# Patient Record
Sex: Male | Born: 1975 | Race: Black or African American | Hispanic: No | Marital: Single | Smoking: Current every day smoker
Health system: Southern US, Community
[De-identification: ages and names within clinical notes are randomized; demographics above are authoritative.]

## PROBLEM LIST (undated history)

## (undated) DIAGNOSIS — I10 Essential (primary) hypertension: Secondary | ICD-10-CM

---

## 2008-02-02 ENCOUNTER — Emergency Department: Payer: Self-pay | Admitting: Emergency Medicine

## 2008-12-10 ENCOUNTER — Emergency Department (HOSPITAL_COMMUNITY): Admission: EM | Admit: 2008-12-10 | Discharge: 2008-12-10 | Payer: Self-pay | Admitting: Emergency Medicine

## 2011-04-28 ENCOUNTER — Emergency Department: Payer: Self-pay | Admitting: Emergency Medicine

## 2011-05-20 ENCOUNTER — Encounter (HOSPITAL_COMMUNITY): Payer: Self-pay

## 2011-05-20 ENCOUNTER — Emergency Department (HOSPITAL_COMMUNITY)
Admission: EM | Admit: 2011-05-20 | Discharge: 2011-05-20 | Disposition: A | Discharge status: Home / Self Care | Payer: Self-pay | Attending: Emergency Medicine | Admitting: Emergency Medicine

## 2011-05-20 DIAGNOSIS — R300 Dysuria: Secondary | ICD-10-CM | POA: Insufficient documentation

## 2011-05-20 DIAGNOSIS — A64 Unspecified sexually transmitted disease: Secondary | ICD-10-CM | POA: Insufficient documentation

## 2011-05-20 DIAGNOSIS — R369 Urethral discharge, unspecified: Secondary | ICD-10-CM | POA: Insufficient documentation

## 2011-05-20 MED ORDER — AZITHROMYCIN 250 MG PO TABS
1000.0000 mg | ORAL_TABLET | Freq: Once | ORAL | Status: AC
  Administered 2011-05-20: 1000 mg via ORAL
  Filled 2011-05-20: qty 4

## 2011-05-20 MED ORDER — CEFTRIAXONE SODIUM 250 MG IJ SOLR
250.0000 mg | Freq: Once | INTRAMUSCULAR | Status: AC
  Administered 2011-05-20: 250 mg via INTRAMUSCULAR
  Filled 2011-05-20: qty 250

## 2011-05-20 MED ORDER — LIDOCAINE HCL (PF) 1 % IJ SOLN
INTRAMUSCULAR | Status: AC
  Filled 2011-05-20: qty 5

## 2011-05-20 NOTE — ED Notes (Signed)
Yesterday noted clear discharge and pain with uriantion from penis.

## 2011-05-20 NOTE — Discharge Instructions (Signed)
You have a sexually transmitted disease.  We have treated U. in emergency Department, you do not need any further treatment.  Have your sexual partners evaluated for infection as well.  Use condoms when you have intercourse to prevent recurrence of infections including AIDS and herpes, which are both not curable diseases.

## 2011-05-20 NOTE — ED Provider Notes (Signed)
History     CSN: [id number]  Arrival date & time 05/20/11  7829   First MD Initiated Contact with Patient 05/20/11 1127      Chief Complaint  Patient presents with  . Penile Discharge    (Consider location/radiation/quality/duration/timing/severity/associated sxs/prior treatment) Patient is a 36 y.o. male presenting with penile discharge. The history is provided by the patient.  Penile Discharge Pertinent negatives include no abdominal pain and no headaches.   the patient is a 36 year old, male, with no significant past medical history presents emergency department complaining of a penile discharge and dysuria.  Since last night.  He states that he had unprotected sex with more than one sexual partner.  This past weekend.  He denies nausea, vomiting, fevers, chills, or sore on his penis.  He is allergic to no medications.  The  History reviewed. No pertinent past medical history.  History reviewed. No pertinent past surgical history.  History reviewed. No pertinent family history.  History  Substance Use Topics  . Smoking status: Current Everyday Smoker  . Smokeless tobacco: Not on file  . Alcohol Use: Yes      Review of Systems  Constitutional: Negative for fever and chills.  Gastrointestinal: Negative for nausea, vomiting and abdominal pain.  Genitourinary: Positive for dysuria and discharge. Negative for penile swelling, genital sores and penile pain.  Skin: Negative for rash.  Neurological: Negative for headaches.  Hematological: Negative for adenopathy.  Psychiatric/Behavioral: Negative for confusion.  All other systems reviewed and are negative.    Allergies  Review of patient's allergies indicates no known allergies.  Home Medications  No current outpatient prescriptions on file.  BP 167/93  Pulse 85  Temp 97.9 F (36.6 C)  Resp 16  SpO2 100%  Physical Exam  Vitals reviewed. Constitutional: He appears well-developed and well-nourished.  HENT:    Head: Normocephalic and atraumatic.  Eyes: Pupils are equal, round, and reactive to light.  Neck: Normal range of motion.  Pulmonary/Chest: Effort normal.  Genitourinary: No penile tenderness.       Yellow discharge from urethral meatus  Musculoskeletal: Normal range of motion.  Neurological: He is alert.  Skin: Skin is warm and dry. No rash noted.  Psychiatric: He has a normal mood and affect. Thought content normal.    ED Course  Procedures (including critical care time) Penile discharge in young male, who has had recent unprotected intercourse with more than one sexual partner.  No systemic symptoms.  No past medical history.  Urethral swab was performed in the emergency department and antibiotics administered  Labs Reviewed - No data to display No results found.   No diagnosis found.    MDM  Sexually transmitted disease, without any systemic symptoms.        Cheri Guppy, MD 05/20/11 1143

## 2011-05-20 NOTE — ED Notes (Signed)
Patient states is sexual active 3 days ago ago and developed tingling burning sensation tip of penis, dysuria, and clear mucous drainage one day ago.  Pain 7-10/10.

## 2011-05-21 LAB — GC/CHLAMYDIA PROBE AMP, GENITAL: Chlamydia, DNA Probe: NEGATIVE

## 2011-07-08 ENCOUNTER — Emergency Department: Payer: Self-pay | Admitting: *Deleted

## 2011-07-08 LAB — COMPREHENSIVE METABOLIC PANEL
Albumin: 4.2 g/dL (ref 3.4–5.0)
Anion Gap: 7 (ref 7–16)
BUN: 18 mg/dL (ref 7–18)
Bilirubin,Total: 1.1 mg/dL — ABNORMAL HIGH (ref 0.2–1.0)
Calcium, Total: 9.2 mg/dL (ref 8.5–10.1)
Chloride: 106 mmol/L (ref 98–107)
EGFR (Non-African Amer.): >60
Glucose: 129 mg/dL — ABNORMAL HIGH (ref 65–99)

## 2011-07-08 LAB — CBC
HGB: 17.9 g/dL (ref 13.0–18.0)
MCV: 88 fL (ref 80–100)
WBC: 8.5 10*3/uL (ref 3.8–10.6)

## 2012-08-06 ENCOUNTER — Emergency Department: Payer: Self-pay | Admitting: Emergency Medicine

## 2012-08-06 LAB — URINALYSIS, COMPLETE
Bilirubin,UR: NEGATIVE
Ph: 6 (ref 4.5–8.0)
Protein: NEGATIVE
Squamous Epithelial: NONE SEEN

## 2012-08-06 LAB — GC/CHLAMYDIA PROBE AMP

## 2012-10-23 ENCOUNTER — Emergency Department: Payer: Self-pay | Admitting: Emergency Medicine

## 2013-09-12 IMAGING — CR RIGHT THUMB 2+V
1 series · 3 of 3 positions shown · non-contrast
Comparison: none

REASON FOR EXAM: pain/edema
COMMENTS:   May transport without cardiac monitor

PROCEDURE:     DXR - DXR THUMB RIGHT HAND (1ST DIGIT)  - April 28, 2011 [DATE]
RESULT:     No fracture, dislocation or other acute bony abnormality is
identified.

[Series 1: x finger pa right · 0.14mm/px · 3 of 3 slices shown]
[im 1/3]
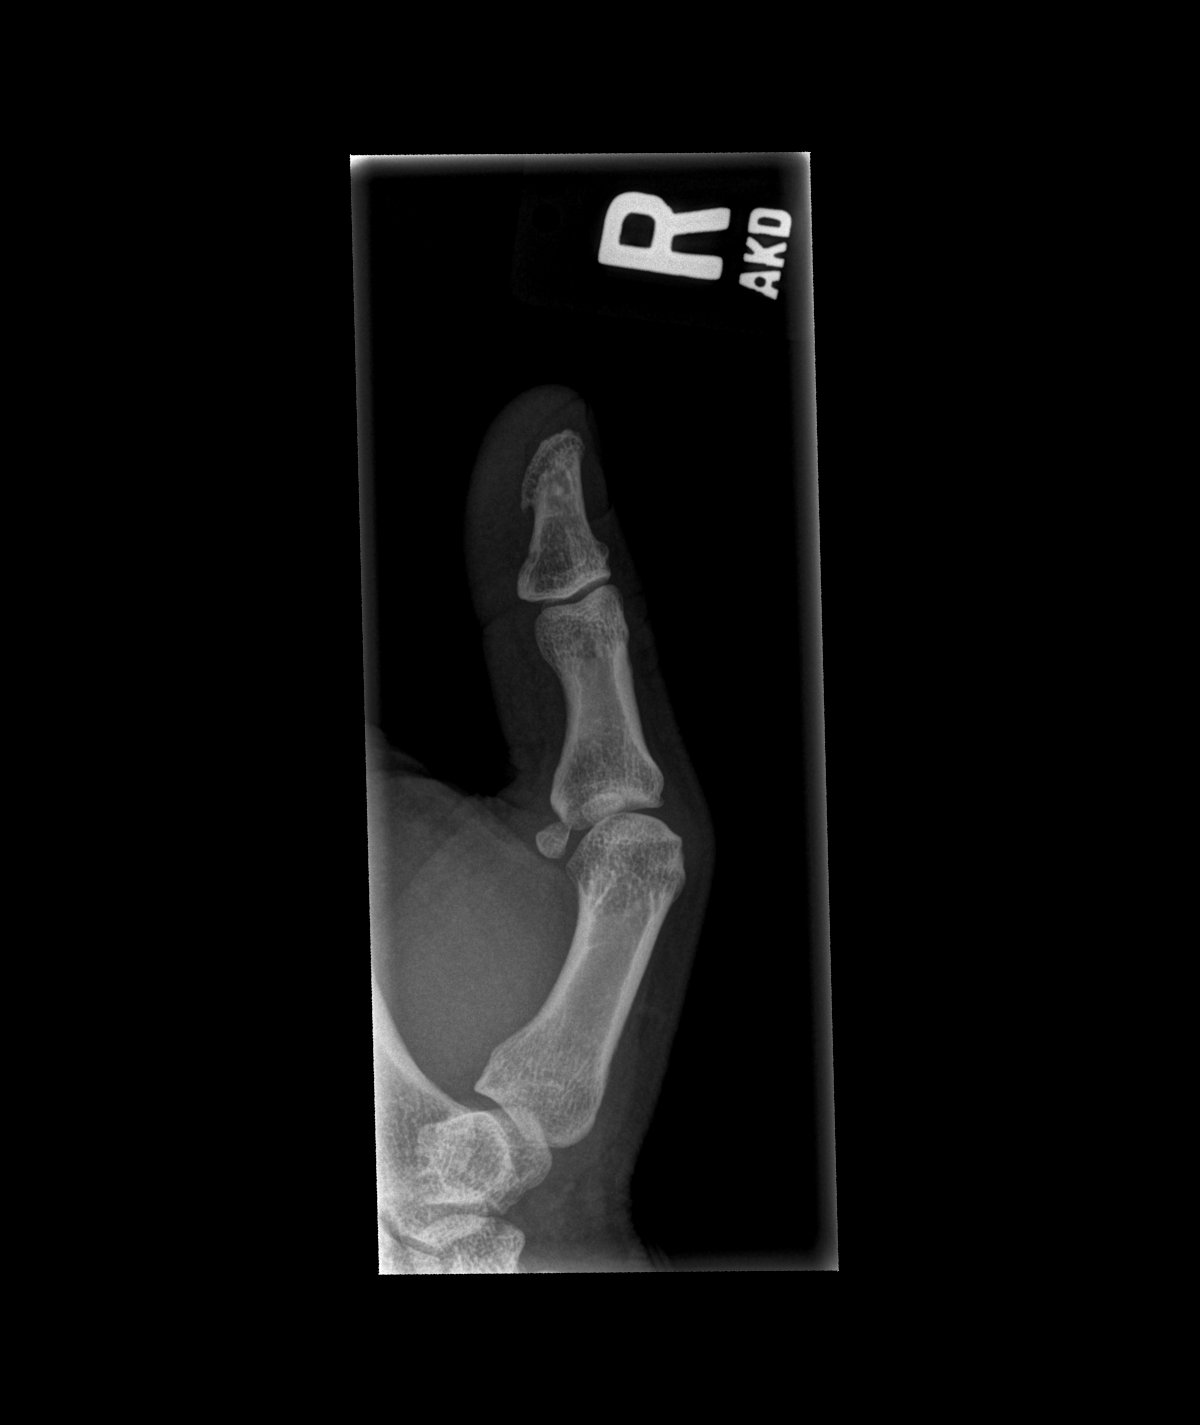
[im 2/3]
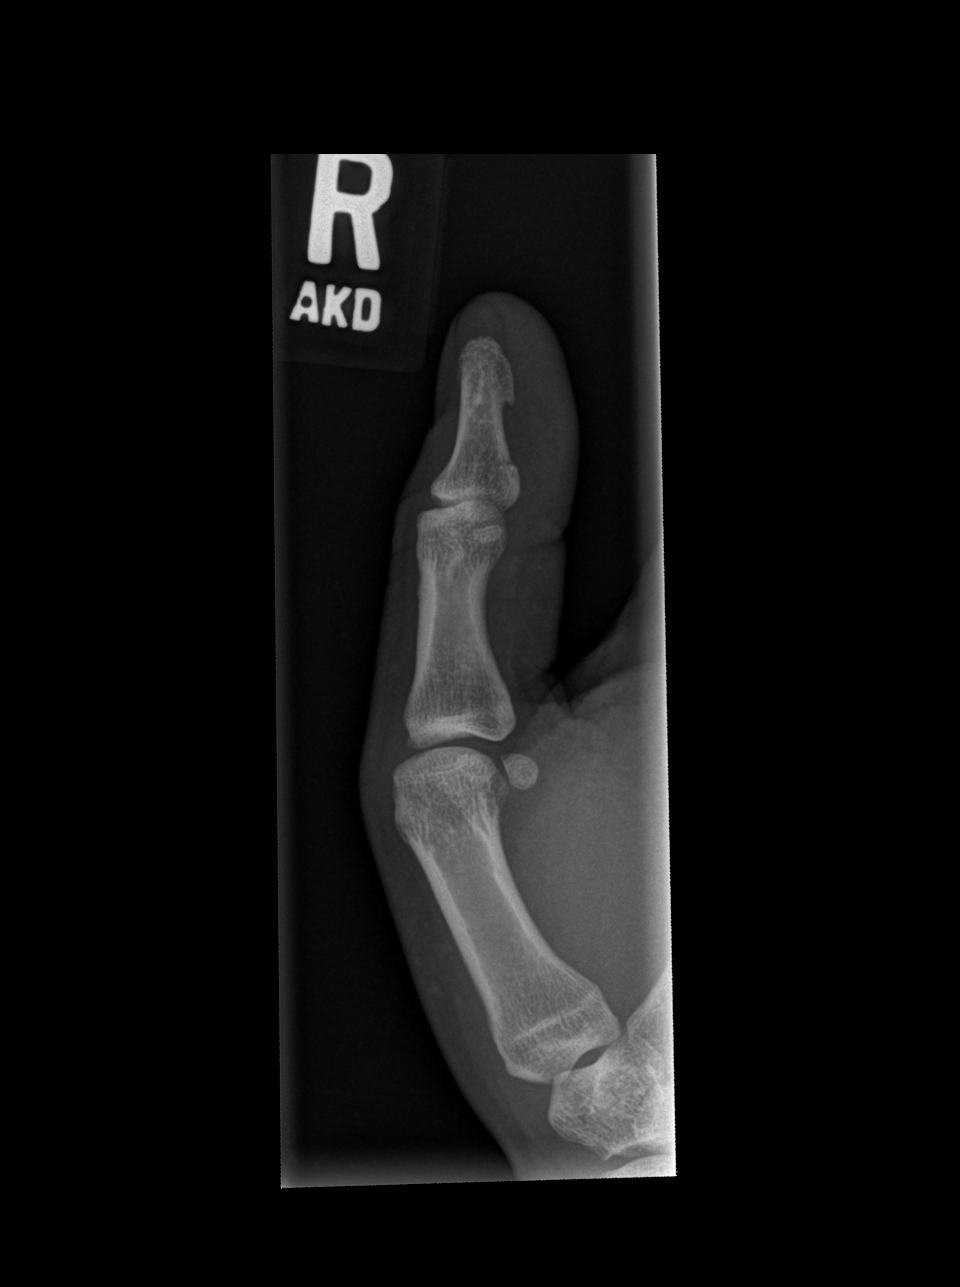
[im 3/3]
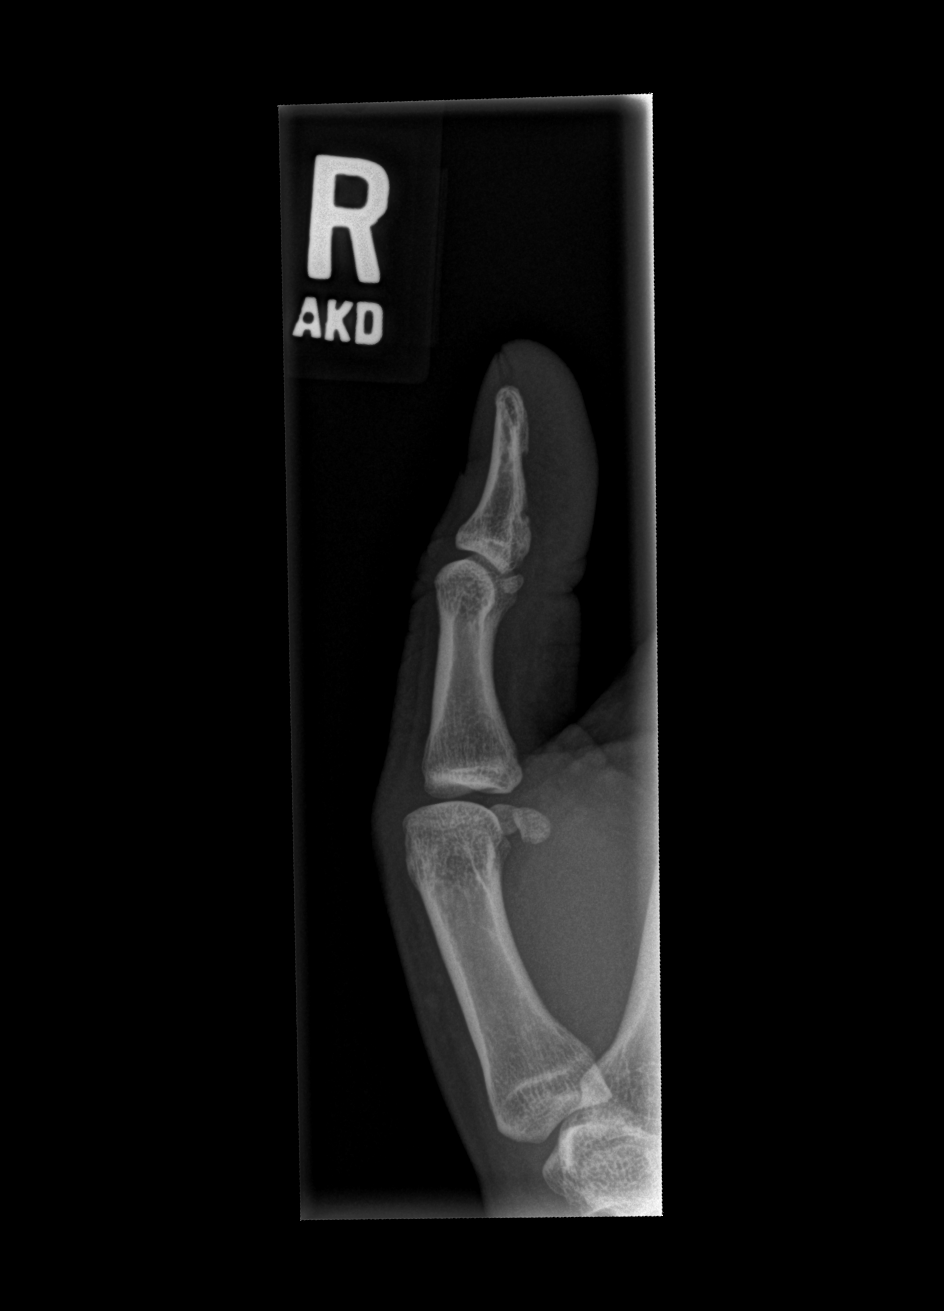

[3 of 3 positions shown; findings below may reference images not displayed]

IMPRESSION: No significant osseous abnormalities are noted.

## 2015-05-08 ENCOUNTER — Emergency Department
Admission: EM | Admit: 2015-05-08 | Discharge: 2015-05-08 | Disposition: A | Discharge status: Home / Self Care | Payer: BLUE CROSS/BLUE SHIELD | Attending: Emergency Medicine | Admitting: Emergency Medicine

## 2015-05-08 ENCOUNTER — Encounter: Payer: Self-pay | Admitting: Emergency Medicine

## 2015-05-08 DIAGNOSIS — J029 Acute pharyngitis, unspecified: Secondary | ICD-10-CM | POA: Diagnosis present

## 2015-05-08 DIAGNOSIS — I10 Essential (primary) hypertension: Secondary | ICD-10-CM | POA: Diagnosis not present

## 2015-05-08 DIAGNOSIS — J069 Acute upper respiratory infection, unspecified: Secondary | ICD-10-CM | POA: Diagnosis not present

## 2015-05-08 DIAGNOSIS — F17200 Nicotine dependence, unspecified, uncomplicated: Secondary | ICD-10-CM | POA: Insufficient documentation

## 2015-05-08 HISTORY — DX: Essential (primary) hypertension: I10

## 2015-05-08 LAB — POCT RAPID STREP A: STREPTOCOCCUS, GROUP A SCREEN (DIRECT): NEGATIVE

## 2015-05-08 MED ORDER — CETIRIZINE HCL 10 MG PO TABS: 10.0000 mg | ORAL_TABLET | Freq: Every day | ORAL | Status: AC

## 2015-05-08 MED ORDER — FLUTICASONE PROPIONATE 50 MCG/ACT NA SUSP: 1.0000 | Freq: Two times a day (BID) | NASAL | Status: AC

## 2015-05-08 MED ORDER — BENZONATATE 200 MG PO CAPS: 200.0000 mg | ORAL_CAPSULE | Freq: Three times a day (TID) | ORAL | Status: AC | PRN

## 2015-05-08 MED ORDER — MAGIC MOUTHWASH W/LIDOCAINE: 5.0000 mL | Freq: Four times a day (QID) | ORAL | Status: AC

## 2015-05-08 NOTE — ED Notes (Signed)
Pt reports cough, nasal congestion, body aches, sore throat. Denies fever, n/v/d. Started two days ago. Pt alert & oriented with warm, dry skin.

## 2015-05-08 NOTE — ED Notes (Signed)
C/o sore throat and cough.  Onset of symptoms 2 days ago.  Also describes sinus congestion.

## 2015-05-08 NOTE — Discharge Instructions (Signed)
Viral Infections °A viral infection can be caused by different types of viruses. Most viral infections are not serious and resolve on their own. However, some infections may cause severe symptoms and may lead to further complications. °SYMPTOMS °Viruses can frequently cause: °· Minor sore throat. °· Aches and pains. °· Headaches. °· Runny nose. °· Different types of rashes. °· Watery eyes. °· Tiredness. °· Cough. °· Loss of appetite. °· Gastrointestinal infections, resulting in nausea, vomiting, and diarrhea. °These symptoms do not respond to antibiotics because the infection is not caused by bacteria. However, you might catch a bacterial infection following the viral infection. This is sometimes called a "superinfection." Symptoms of such a bacterial infection may include: °· Worsening sore throat with pus and difficulty swallowing. °· Swollen neck glands. °· Chills and a high or persistent fever. °· Severe headache. °· Tenderness over the sinuses. °· Persistent overall ill feeling (malaise), muscle aches, and tiredness (fatigue). °· Persistent cough. °· Yellow, green, or brown mucus production with coughing. °HOME CARE INSTRUCTIONS  °· Only take over-the-counter or prescription medicines for pain, discomfort, diarrhea, or fever as directed by your caregiver. °· Drink enough water and fluids to keep your urine clear or pale yellow. Sports drinks can provide valuable electrolytes, sugars, and hydration. °· Get plenty of rest and maintain proper nutrition. Soups and broths with crackers or rice are fine. °SEEK IMMEDIATE MEDICAL CARE IF:  °· You have severe headaches, shortness of breath, chest pain, neck pain, or an unusual rash. °· You have uncontrolled vomiting, diarrhea, or you are unable to keep down fluids. °· You or your child has an oral temperature above 102° F (38.9° C), not controlled by medicine. °· Your baby is older than 3 months with a rectal temperature of 102° F (38.9° C) or higher. °· Your baby is 3  months old or younger with a rectal temperature of 100.4° F (38° C) or higher. °MAKE SURE YOU:  °· Understand these instructions. °· Will watch your condition. °· Will get help right away if you are not doing well or get worse. °  °This information is not intended to replace advice given to you by your health care provider. Make sure you discuss any questions you have with your health care provider. °  °Document Released: 11/28/2004 Document Revised: 05/13/2011 Document Reviewed: 07/27/2014 °Elsevier Interactive Patient Education ©2016 [employer] ° °

## 2015-05-08 NOTE — ED Provider Notes (Signed)
[facility] Emergency Department Provider Note  ____________________________________________  Time seen: Approximately 5:53 PM  I have reviewed the triage vital signs and the nursing notes.   HISTORY  Chief Complaint Sore Throat and Cough    HPI Kyle Ingram is a 40 y.o. male who presents emergency department for a 3 day history of nasal congestion, cough, sore throat, myalgias. Patient states that he has not checked his temperature but does not believe that he has one. He has not had any nausea, vomiting, diarrhea. Patient has not taken any medications prior to arrival.   Past Medical History  Diagnosis Date  . Hypertension     There are no active problems to display for this patient.   History reviewed. No pertinent past surgical history.  Current Outpatient Rx  Name  Route  Sig  Dispense  Refill  . benzonatate (TESSALON) 200 MG capsule   Oral   Take 1 capsule (200 mg total) by mouth 3 (three) times daily as needed for cough.   21 capsule   0   . cetirizine (ZYRTEC) 10 MG tablet   Oral   Take 1 tablet (10 mg total) by mouth daily.   30 tablet   0   . fluticasone (FLONASE) 50 MCG/ACT nasal spray   Each Nare   Place 1 spray into both nostrils 2 (two) times daily.   16 g   0   . magic mouthwash w/lidocaine SOLN   Oral   Take 5 mLs by mouth 4 (four) times daily.   240 mL   0     Dispense in a 1/1/1/1 ratio. Use lidocaine, diphen ...     Allergies Review of patient's allergies indicates no known allergies.  No family history on file.  Social History Social History  Substance Use Topics  . Smoking status: Current Every Day Smoker  . Smokeless tobacco: None  . Alcohol Use: Yes     Review of Systems  Constitutional: No fever/chills Eyes: No visual changes. No discharge ENT: Positive for sore throat. Positive for nasal congestion. Cardiovascular: no chest pain. Respiratory: Positive for cough. No SOB. Gastrointestinal: No  abdominal pain.  No nausea, no vomiting.  No diarrhea.  No constipation. Skin: Negative for rash. Neurological: Negative for headaches, focal weakness or numbness. 10-point ROS otherwise negative.  ____________________________________________   PHYSICAL EXAM:  VITAL SIGNS: ED Triage Vitals  Enc Vitals Group     BP 05/08/15 1630 136/81 mmHg     Pulse Rate 05/08/15 1630 80     Resp 05/08/15 1630 20     Temp 05/08/15 1630 98.5 F (36.9 C)     Temp Source 05/08/15 1630 Oral     SpO2 05/08/15 1630 98 %     Weight 05/08/15 1630 243 lb (110.224 kg)     Height 05/08/15 1630 6' (1.829 m)     Head Cir --      Peak Flow --      Pain Score 05/08/15 1637 3     Pain Loc --      Pain Edu? --      Excl. in GC? --      Constitutional: Alert and oriented. Well appearing and in no acute distress. Eyes: Conjunctivae are normal. PERRL. EOMI. Head: Atraumatic. ENT:      Ears: EACs and TMs are unremarkable bilaterally.      Nose: Moderate clear congestion/rhinnorhea.      Mouth/Throat: Mucous membranes are moist. Oropharynx is mildly erythematous but  nonedematous. Uvula is midline. Tonsils are unremarkable bilaterally. Neck: No stridor.   Hematological/Lymphatic/Immunilogical: No cervical lymphadenopathy. Cardiovascular: Normal rate, regular rhythm. Normal S1 and S2.  Good peripheral circulation. Respiratory: Normal respiratory effort without tachypnea or retractions. Lungs CTAB. Neurologic:  Normal speech and language. No gross focal neurologic deficits are appreciated.  Skin:  Skin is warm, dry and intact. No rash noted. Psychiatric: Mood and affect are normal. Speech and behavior are normal. Patient exhibits appropriate insight and judgement.   ____________________________________________   LABS (all labs ordered are listed, but only abnormal results are displayed)  Labs Reviewed  POCT RAPID STREP A    ____________________________________________  EKG   ____________________________________________  RADIOLOGY   No results found.  ____________________________________________    PROCEDURES  Procedure(s) performed:       Medications - No data to display   ____________________________________________   INITIAL IMPRESSION / ASSESSMENT AND PLAN / ED COURSE  Pertinent labs & imaging results that were available during my care of the patient were reviewed by me and considered in my medical decision making (see chart for details).  Patient's diagnosis is consistent with viral upper respiratory infection. Patient has a negative strep test emergency department patient does not meet [facility] criteria. Symptoms were insidious on onset and is likely not flu. . Patient will be discharged home with prescriptions for Zyrtec, Flonase, Magic mouthwash, and Tessalon Perles. Patient is to follow up with primary care provider if symptoms persist past this treatment course. Patient is given ED precautions to return to the ED for any worsening or new symptoms.     ____________________________________________  FINAL CLINICAL IMPRESSION(S) / ED DIAGNOSES  Final diagnoses:  Viral upper respiratory infection      NEW MEDICATIONS STARTED DURING THIS VISIT:  New Prescriptions   BENZONATATE (TESSALON) 200 MG CAPSULE    Take 1 capsule (200 mg total) by mouth 3 (three) times daily as needed for cough.   CETIRIZINE (ZYRTEC) 10 MG TABLET    Take 1 tablet (10 mg total) by mouth daily.   FLUTICASONE (FLONASE) 50 MCG/ACT NASAL SPRAY    Place 1 spray into both nostrils 2 (two) times daily.   MAGIC MOUTHWASH W/LIDOCAINE SOLN    Take 5 mLs by mouth 4 (four) times daily.        This chart was dictated using voice recognition software/Dragon. Despite best efforts to proofread, errors can occur which can change the meaning. Any change was purely unintentional.    Racheal PatchesJonathan D Roniyah Llorens,  PA-C 05/08/15 1759  Jeanmarie PlantJames A McShane, MD 05/08/15 2011

## 2015-12-15 ENCOUNTER — Emergency Department
Admission: EM | Admit: 2015-12-15 | Discharge: 2015-12-15 | Disposition: A | Discharge status: Home / Self Care | Payer: BLUE CROSS/BLUE SHIELD | Attending: Emergency Medicine | Admitting: Emergency Medicine

## 2015-12-15 ENCOUNTER — Encounter: Payer: Self-pay | Admitting: Medical Oncology

## 2015-12-15 DIAGNOSIS — F17200 Nicotine dependence, unspecified, uncomplicated: Secondary | ICD-10-CM | POA: Diagnosis not present

## 2015-12-15 DIAGNOSIS — Z760 Encounter for issue of repeat prescription: Secondary | ICD-10-CM | POA: Insufficient documentation

## 2015-12-15 DIAGNOSIS — I10 Essential (primary) hypertension: Secondary | ICD-10-CM | POA: Diagnosis not present

## 2015-12-15 DIAGNOSIS — Z79899 Other long term (current) drug therapy: Secondary | ICD-10-CM | POA: Insufficient documentation

## 2015-12-15 DIAGNOSIS — R51 Headache: Secondary | ICD-10-CM | POA: Diagnosis not present

## 2015-12-15 MED ORDER — AMLODIPINE BESYLATE 5 MG PO TABS: 5.0000 mg | ORAL_TABLET | Freq: Every day | ORAL | 11 refills | Status: AC

## 2015-12-15 MED ORDER — LISINOPRIL-HYDROCHLOROTHIAZIDE 20-25 MG PO TABS: 1.0000 | ORAL_TABLET | Freq: Every day | ORAL | 2 refills | Status: AC

## 2015-12-15 NOTE — ED Triage Notes (Signed)
Pt reports he has not had his BP meds in a few months and about 2 days ago he began having headache as if his BP was elevated.

## 2015-12-15 NOTE — ED Provider Notes (Signed)
[facility] Emergency Department Provider Note        Time seen: ----------------------------------------- 2:49 PM on 12/15/2015 -----------------------------------------    I have reviewed the triage vital signs and the nursing notes.   HISTORY  Chief Complaint Hypertension and Headache    HPI Kyle CapesRon Hoerner is a 40 y.o. male who presents to ER for prescription refill. Patient states she's not has blood pressure medicines and a few months in 2 days ago he noticed he began having a headache and that his blood pressure was elevated. He presents with blood pressure around 180 systolic. He denies recent illness or other complaints.   Past Medical History:  Diagnosis Date  . Hypertension     There are no active problems to display for this patient.   History reviewed. No pertinent surgical history.  Allergies Review of patient's allergies indicates no known allergies.  Social History Social History  Substance Use Topics  . Smoking status: Current Every Day Smoker  . Smokeless tobacco: Not on file  . Alcohol use Yes    Review of Systems Constitutional: Negative for fever. Cardiovascular: Negative for chest pain. Respiratory: Negative for shortness of breath. Gastrointestinal: Negative for abdominal pain, vomiting and diarrhea. Neurological: Positive for recent headache  10-point ROS otherwise negative.  ____________________________________________   PHYSICAL EXAM:  VITAL SIGNS: ED Triage Vitals  Enc Vitals Group     BP 12/15/15 1422 (!) 179/119     Pulse Rate 12/15/15 1422 81     Resp 12/15/15 1422 17     Temp 12/15/15 1422 98.2 F (36.8 C)     Temp Source 12/15/15 1422 Oral     SpO2 12/15/15 1422 98 %     Weight 12/15/15 1423 248 lb (112.5 kg)     Height 12/15/15 1423 6' (1.829 m)     Head Circumference --      Peak Flow --      Pain Score 12/15/15 1423 5     Pain Loc --      Pain Edu? --      Excl. in GC? --      Constitutional: Alert and oriented. Well appearing and in no distress. Eyes: Conjunctivae are normal. PERRL. Normal extraocular movements. ENT   Head: Normocephalic and atraumatic.   Nose: No congestion/rhinnorhea.   Mouth/Throat: Mucous membranes are moist.   Neck: No stridor. Cardiovascular: Normal rate, regular rhythm. No murmurs, rubs, or gallops. Respiratory: Normal respiratory effort without tachypnea nor retractions. Breath sounds are clear and equal bilaterally. No wheezes/rales/rhonchi. Gastrointestinal: Soft and nontender. Normal bowel sounds Musculoskeletal: Nontender with normal range of motion in all extremities. No lower extremity tenderness nor edema. Neurologic:  Normal speech and language. No gross focal neurologic deficits are appreciated.  Skin:  Skin is warm, dry and intact. No rash noted. Psychiatric: Mood and affect are normal. Speech and behavior are normal.  ____________________________________________  ED COURSE:  Pertinent labs & imaging results that were available during my care of the patient were reviewed by me and considered in my medical decision making (see chart for details). Clinical Course  Patient is in no distress, essentially with asymptomatic hypertension. Patient is requesting prescription refill.  Procedures ____________________________________________  FINAL ASSESSMENT AND PLAN  Hypertension, medication refill  Plan: Patient with a symptomatically hypertension. I will re-fill his lisinopril/HCTZ and amlodipine. He is stable for outpatient follow-up with his doctor.   Emily FilbertWilliams, Jonathan E, MD   Note: This dictation was prepared with Dragon dictation. Any transcriptional errors  that result from this process are unintentional    Emily Filbert, MD 12/15/15 1450

## 2016-05-27 ENCOUNTER — Emergency Department: Payer: Self-pay

## 2016-05-27 ENCOUNTER — Emergency Department
Admission: EM | Admit: 2016-05-27 | Discharge: 2016-05-27 | Disposition: A | Discharge status: Home / Self Care | Payer: Self-pay | Attending: Emergency Medicine | Admitting: Emergency Medicine

## 2016-05-27 ENCOUNTER — Encounter: Payer: Self-pay | Admitting: Emergency Medicine

## 2016-05-27 DIAGNOSIS — F17210 Nicotine dependence, cigarettes, uncomplicated: Secondary | ICD-10-CM | POA: Insufficient documentation

## 2016-05-27 DIAGNOSIS — R079 Chest pain, unspecified: Secondary | ICD-10-CM | POA: Insufficient documentation

## 2016-05-27 DIAGNOSIS — I10 Essential (primary) hypertension: Secondary | ICD-10-CM | POA: Insufficient documentation

## 2016-05-27 DIAGNOSIS — Z79899 Other long term (current) drug therapy: Secondary | ICD-10-CM | POA: Insufficient documentation

## 2016-05-27 DIAGNOSIS — M25512 Pain in left shoulder: Secondary | ICD-10-CM | POA: Insufficient documentation

## 2016-05-27 DIAGNOSIS — N419 Inflammatory disease of prostate, unspecified: Secondary | ICD-10-CM | POA: Insufficient documentation

## 2016-05-27 LAB — CBC
HEMATOCRIT: 50.9 % (ref 40.0–52.0)
HEMOGLOBIN: 17 g/dL (ref 13.0–18.0)
MCH: 29.6 pg (ref 26.0–34.0)
MCHC: 33.4 g/dL (ref 32.0–36.0)
MCV: 88.5 fL (ref 80.0–100.0)
Platelets: 181 10*3/uL (ref 150–440)
RBC: 5.74 MIL/uL (ref 4.40–5.90)
RDW: 15.7 % — AB (ref 11.5–14.5)
WBC: 7.6 10*3/uL (ref 3.8–10.6)

## 2016-05-27 LAB — TROPONIN I: Troponin I: <0.03 ng/mL (ref <0.03)

## 2016-05-27 LAB — BASIC METABOLIC PANEL
Anion gap: 9 (ref 5–15)
BUN: 14 mg/dL (ref 6–20)
CHLORIDE: 104 mmol/L (ref 101–111)
CO2: 27 mmol/L (ref 22–32)
Calcium: 9.2 mg/dL (ref 8.9–10.3)
Creatinine, Ser: 1.08 mg/dL (ref 0.61–1.24)
GFR calc Af Amer: >60 mL/min (ref >60)
GFR calc non Af Amer: >60 mL/min (ref >60)
Glucose, Bld: 145 mg/dL — ABNORMAL HIGH (ref 65–99)
POTASSIUM: 3.3 mmol/L — AB (ref 3.5–5.1)
SODIUM: 140 mmol/L (ref 135–145)

## 2016-05-27 MED ORDER — CEFTRIAXONE SODIUM 250 MG IJ SOLR
250.0000 mg | Freq: Once | INTRAMUSCULAR | Status: AC
  Administered 2016-05-27: 250 mg via INTRAMUSCULAR
  Filled 2016-05-27: qty 250

## 2016-05-27 MED ORDER — DOXYCYCLINE HYCLATE 100 MG PO CAPS: 100.0000 mg | ORAL_CAPSULE | Freq: Two times a day (BID) | ORAL | 0 refills | Status: AC

## 2016-05-27 MED ORDER — LIDOCAINE HCL (PF) 1 % IJ SOLN
INTRAMUSCULAR | Status: AC
  Administered 2016-05-27: 0.9 mL
  Filled 2016-05-27: qty 5

## 2016-05-27 NOTE — ED Provider Notes (Signed)
[facility] Emergency Department Provider Note  ____________________________________________   I have reviewed the triage vital signs and the nursing notes.   HISTORY  Chief Complaint Left chest pain  History limited by: Not Limited   HPI Kyle Ingram is a 41 y.o. male who presents to the emergency department today because of concerns for chest pain. He states that he has been having it on and off for the past month. He describes it as being located in his left upper chest with some radiation into his left arm. 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 He states he is under a lot of stress. In addition patient has secondary complaint of prostatitis. He states he has had it 2 times in the past. He is started to notice some discomfort and pain with ejaculation. He also feels like he is straining 100 for stooling. Denies any fevers.   Past Medical History:  Diagnosis Date  . Hypertension     There are no active problems to display for this patient.   History reviewed. No pertinent surgical history.  Prior to Admission medications   Medication Sig Start Date End Date Taking? Authorizing Provider  amLODipine (NORVASC) 5 MG tablet Take 1 tablet (5 mg total) by mouth daily. 12/15/15 12/14/16  Emily FilbertJonathan E Williams, MD  benzonatate (TESSALON) 200 MG capsule Take 1 capsule (200 mg total) by mouth 3 (three) times daily as needed for cough. 05/08/15   Delorise RoyalsJonathan D Cuthriell, PA-C  cetirizine (ZYRTEC) 10 MG tablet Take 1 tablet (10 mg total) by mouth daily. 05/08/15   Delorise RoyalsJonathan D Cuthriell, PA-C  fluticasone (FLONASE) 50 MCG/ACT nasal spray Place 1 spray into both nostrils 2 (two) times daily. 05/08/15   Delorise RoyalsJonathan D Cuthriell, PA-C  lisinopril-hydrochlorothiazide (PRINZIDE,ZESTORETIC) 20-25 MG tablet Take 1 tablet by mouth daily. 12/15/15   Emily FilbertJonathan E Williams, MD  magic mouthwash w/lidocaine SOLN Take 5 mLs by mouth 4 (four) times daily.  05/08/15   Delorise RoyalsJonathan D Cuthriell, PA-C    Allergies Patient has no known allergies.  No family history on file.  Social History Social History  Substance Use Topics  . Smoking status: Current Every Day Smoker    Packs/day: 1.00    Types: Cigarettes  . Smokeless tobacco: Not on file  . Alcohol use Yes    Review of Systems  Constitutional: Negative for fever. Cardiovascular: Positive for chest pain. Respiratory: Negative for shortness of breath. Gastrointestinal: Negative for abdominal pain, vomiting and diarrhea. Genitourinary: Negative for dysuria. Musculoskeletal: Positive for left shoulder pain. Neurological: Negative for headaches, focal weakness or numbness.  10-point ROS otherwise negative.  ____________________________________________   PHYSICAL EXAM:  VITAL SIGNS: ED Triage Vitals  Enc Vitals Group     BP 05/27/16 1528 136/88     Pulse Rate 05/27/16 1528 71     Resp 05/27/16 1528 18     Temp 05/27/16 1528 97.8 F (36.6 C)     Temp Source 05/27/16 1528 Oral     SpO2 05/27/16 1528 98 %     Weight 05/27/16 1528 240 lb (108.9 kg)     Height 05/27/16 1528 6' (1.829 m)     Head Circumference --      Peak Flow --      Pain Score 05/27/16 1527 2   Constitutional: Alert and oriented. Well appearing and in no distress. Eyes: Conjunctivae are normal. Normal extraocular movements. ENT   Head: Normocephalic and atraumatic.  Nose: No congestion/rhinnorhea.   Mouth/Throat: Mucous membranes are moist.   Neck: No stridor. Hematological/Lymphatic/Immunilogical: No cervical lymphadenopathy. Cardiovascular: Normal rate, regular rhythm.  No murmurs, rubs, or gallops.  Respiratory: Normal respiratory effort without tachypnea nor retractions. Breath sounds are clear and equal bilaterally. No wheezes/rales/rhonchi. Gastrointestinal: Soft and non tender. No rebound. No guarding.  Positive for pain with palpation of the prostate. No nodules.  Genitourinary:  Deferred Musculoskeletal: Normal range of motion in all extremities. No lower extremity edema. Neurologic:  Normal speech and language. No gross focal neurologic deficits are appreciated.  Skin:  Skin is warm, dry and intact. No rash noted. Psychiatric: Mood and affect are normal. Speech and behavior are normal. Patient exhibits appropriate insight and judgment.  ____________________________________________    LABS (pertinent positives/negatives)  Labs Reviewed  BASIC METABOLIC PANEL - Abnormal; Notable for the following:       Result Value   Potassium 3.3 (*)    Glucose, Bld 145 (*)    All other components within normal limits  CBC - Abnormal; Notable for the following:    RDW 15.7 (*)    All other components within normal limits  TROPONIN I     ____________________________________________   EKG  I, Phineas Semen, attending physician, personally viewed and interpreted this EKG  EKG Time: 1533 Rate: 71 Rhythm: normal sinus rhythm Axis: normal Intervals: qtc 430 QRS: narrow ST changes: no st elevation Impression: normal ekg    ____________________________________________    RADIOLOGY  CXR IMPRESSION: No active cardiopulmonary disease.  ____________________________________________   PROCEDURES  Procedures  ____________________________________________   INITIAL IMPRESSION / ASSESSMENT AND PLAN / ED COURSE  Pertinent labs & imaging results that were available during my care of the patient were reviewed by me and considered in my medical decision making (see chart for details).  Patient presented to the emergency Department with 2 primary complaints. The first was for a left upper chest and shoulder pain. Troponin without any concerning acute findings. Given that is been going on for month feel he is safe for outpatient follow-up. Will give him cardiology follow-up information. Additionally patient was concerned for possible prostatitis. His prostate was  somewhat tender on exam. He does state that he was recently sexually active. Will give dose of IM rocephin and discharge on doxycycline.   ____________________________________________   FINAL CLINICAL IMPRESSION(S) / ED DIAGNOSES  Final diagnoses:  Nonspecific chest pain  Prostatitis, unspecified prostatitis type     Note: This dictation was prepared with Dragon dictation. Any transcriptional errors that result from this process are unintentional     Phineas Semen, MD 05/27/16 [phone]

## 2016-05-27 NOTE — ED Notes (Signed)
Pt refused BP recheck, states his BP was "fine earlier."

## 2016-05-27 NOTE — Discharge Instructions (Signed)
Please seek medical attention for any high fevers, chest pain, shortness of breath, change in behavior, persistent vomiting, bloody stool or any other new or concerning symptoms.  

## 2016-06-12 ENCOUNTER — Ambulatory Visit: Payer: Self-pay | Admitting: Internal Medicine

## 2016-07-02 ENCOUNTER — Ambulatory Visit: Payer: Self-pay | Admitting: Internal Medicine

## 2016-08-06 ENCOUNTER — Ambulatory Visit: Payer: Self-pay | Admitting: Internal Medicine

## 2016-08-06 ENCOUNTER — Encounter: Payer: Self-pay | Admitting: Internal Medicine

## 2017-03-19 ENCOUNTER — Encounter: Payer: Self-pay | Admitting: Unknown Physician Specialty

## 2018-10-12 IMAGING — CR DG CHEST 2V
1 series · 2 of 2 positions shown · non-contrast
Comparison: 02/02/2008

CLINICAL DATA: Left-sided chest pain for several days

EXAM:
CHEST  2 VIEW

[Series 1: dg chest 2 view · 0.14mm/px · 2 of 2 slices shown]
[im 1/2]
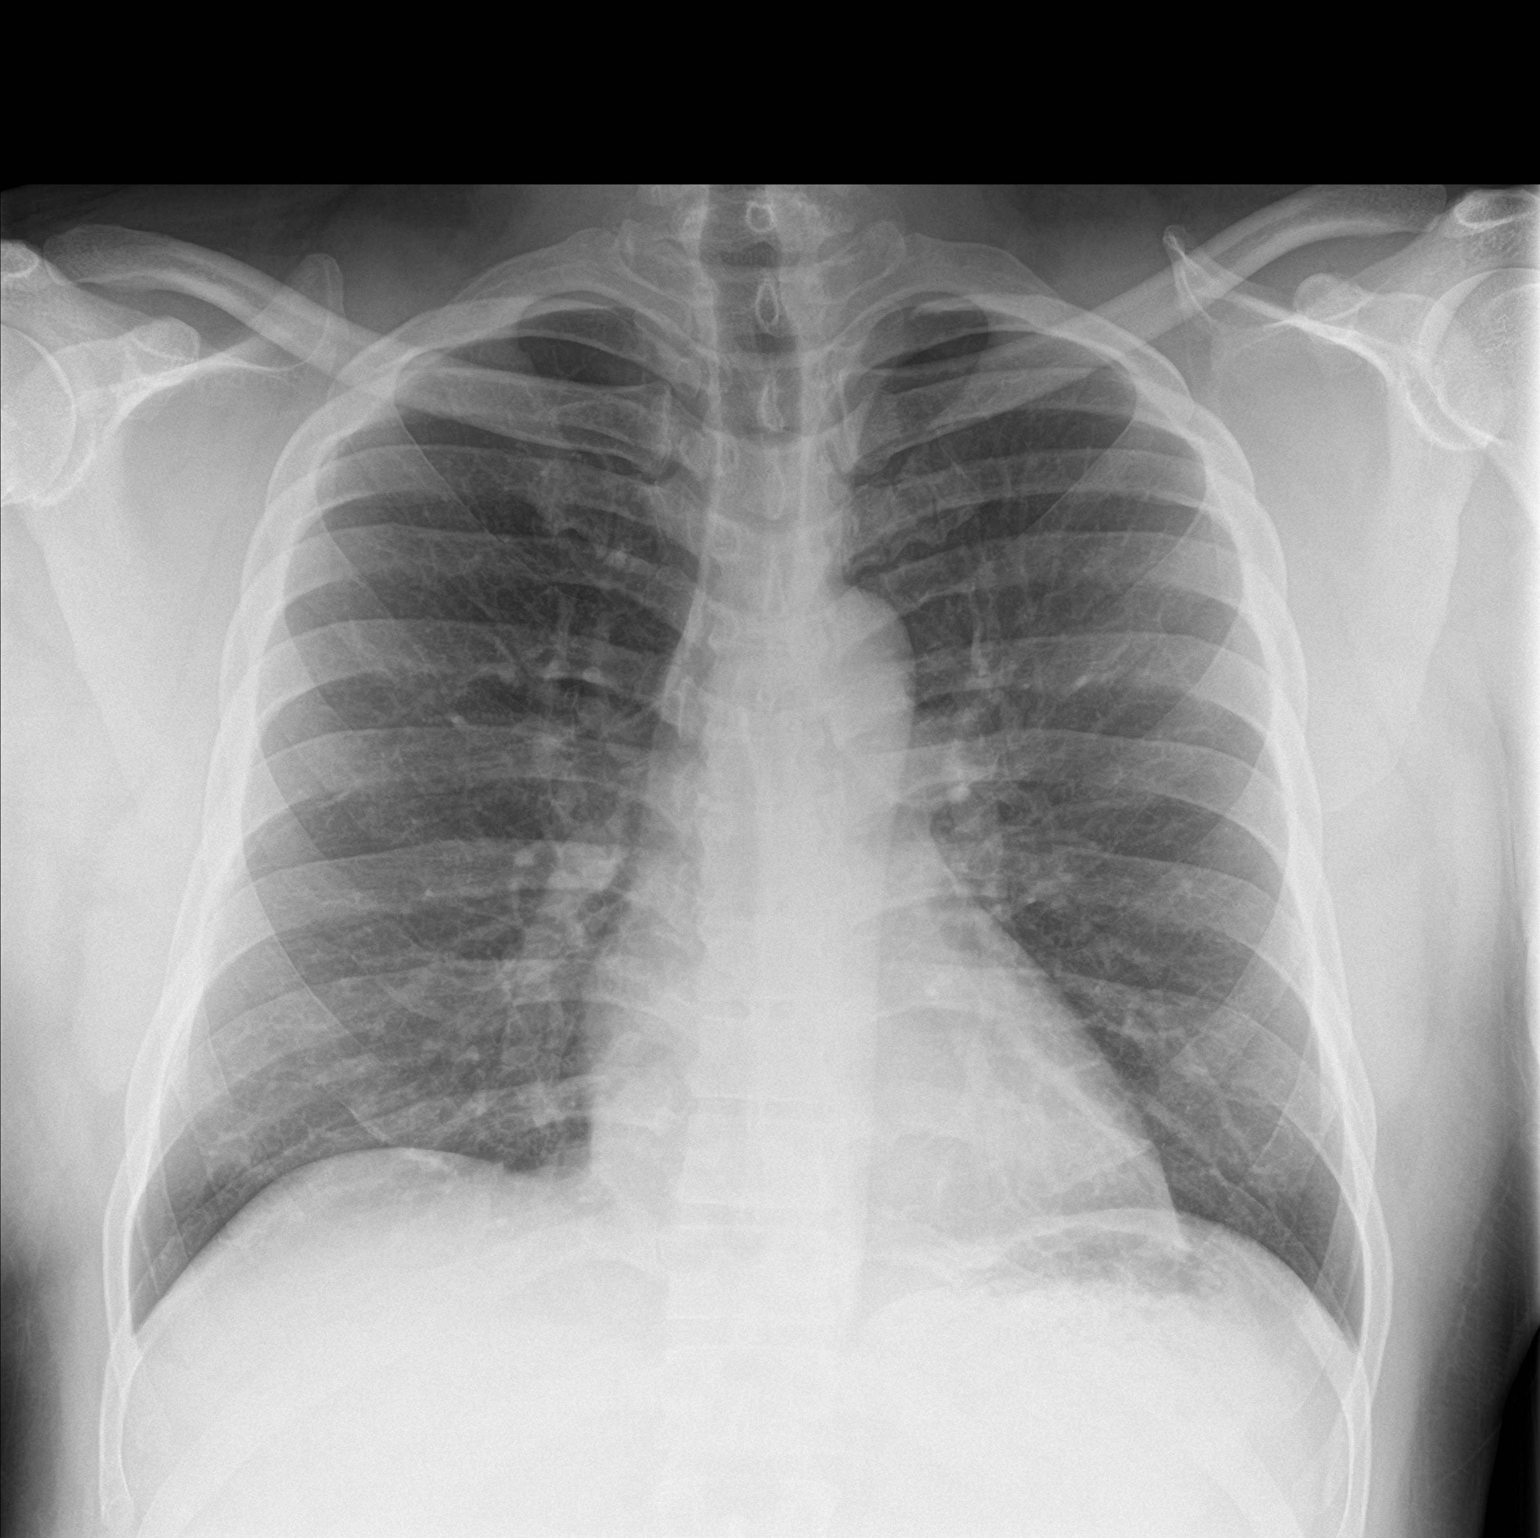
[im 2/2]
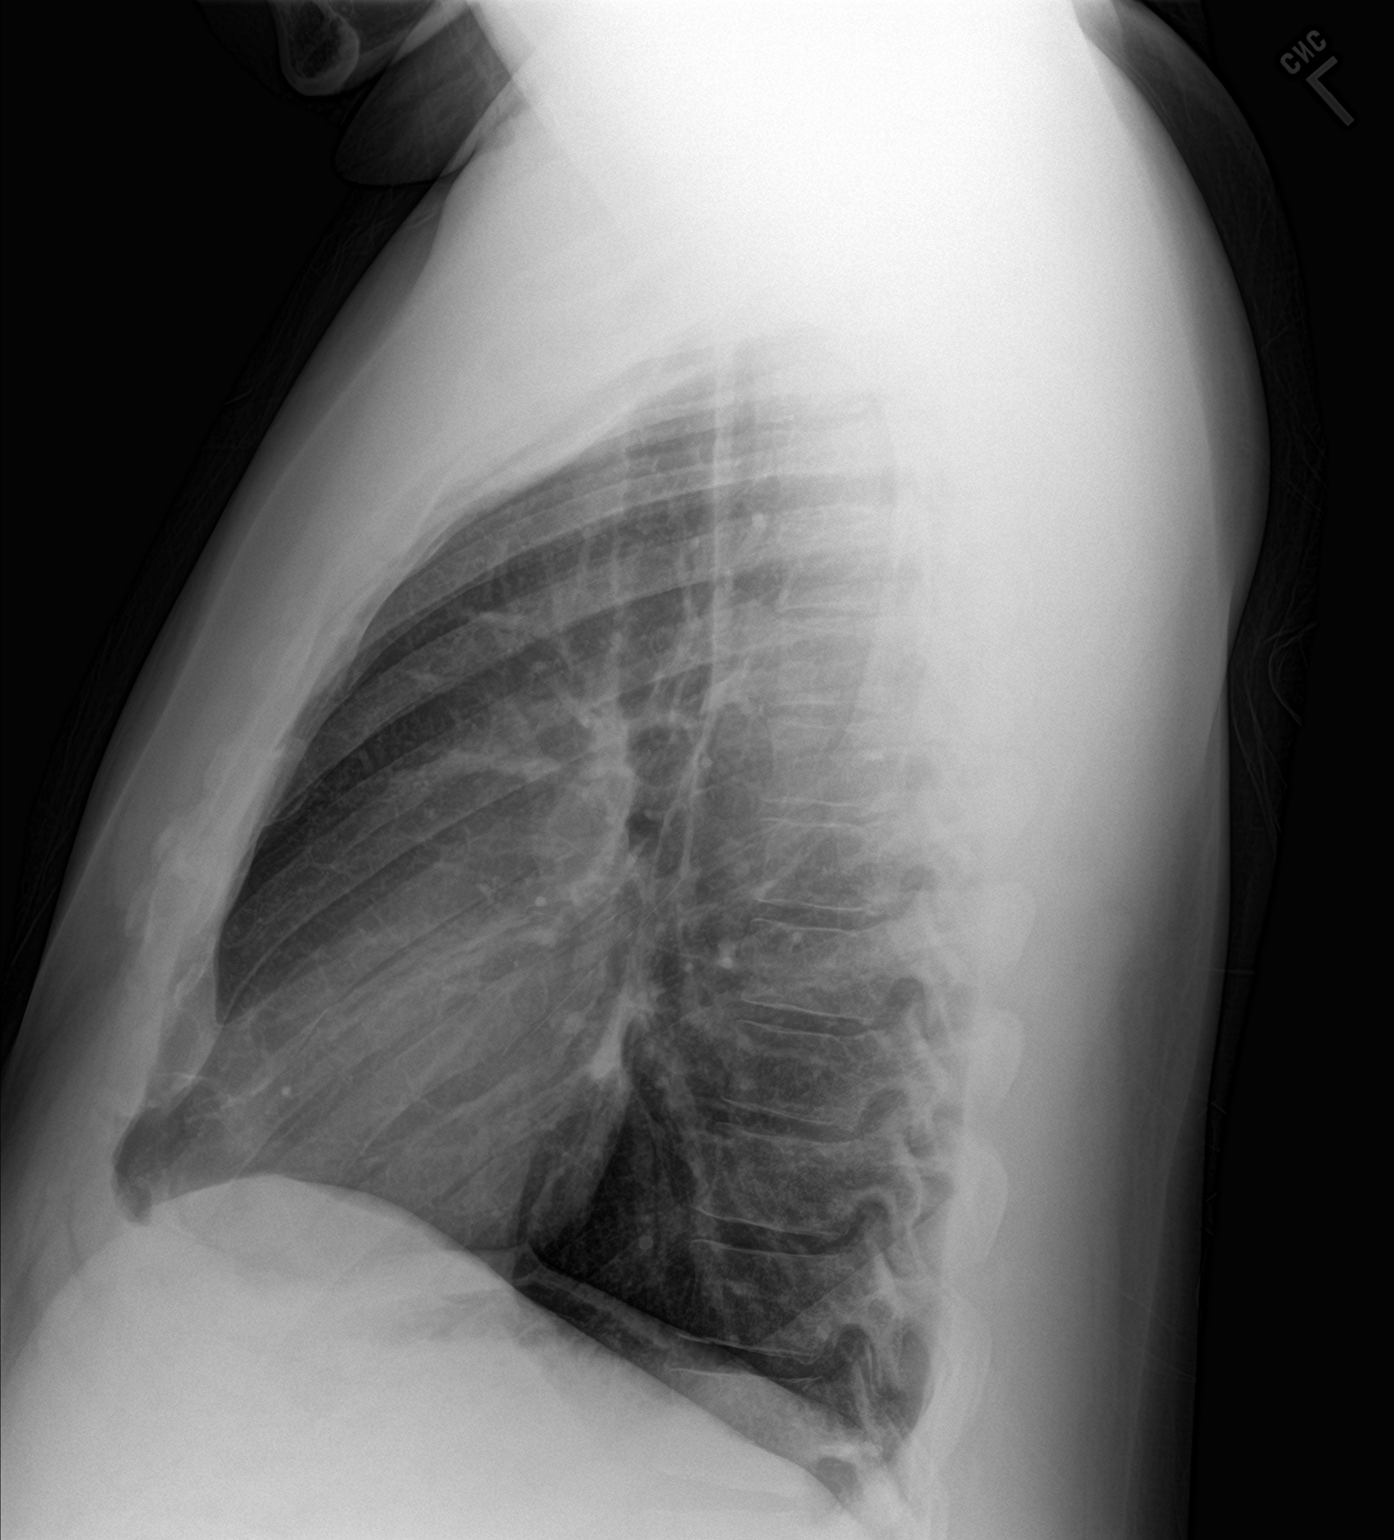

[2 of 2 positions shown; findings below may reference images not displayed]

FINDINGS: Cardiac shadow is within normal limits. The lungs are well aerated
bilaterally. No focal infiltrate or sizable effusion is seen. No
bony abnormality is noted.
IMPRESSION: No active cardiopulmonary disease.

## 2019-03-04 ENCOUNTER — Ambulatory Visit: Payer: Self-pay | Attending: Internal Medicine

## 2019-03-04 DIAGNOSIS — Z20822 Contact with and (suspected) exposure to covid-19: Secondary | ICD-10-CM

## 2019-03-04 DIAGNOSIS — Z20828 Contact with and (suspected) exposure to other viral communicable diseases: Secondary | ICD-10-CM | POA: Insufficient documentation

## 2019-03-10 LAB — NOVEL CORONAVIRUS, NAA
